# Patient Record
Sex: Male | Born: 1960
Health system: Southern US, Community
[De-identification: ages and names within clinical notes are randomized; demographics above are authoritative.]

## PROBLEM LIST (undated history)

## (undated) DIAGNOSIS — I10 Essential (primary) hypertension: Secondary | ICD-10-CM

## (undated) HISTORY — PX: BACK SURGERY: SHX140

---

## 2003-07-21 ENCOUNTER — Emergency Department (HOSPITAL_COMMUNITY): Admission: EM | Admit: 2003-07-21 | Discharge: 2003-07-22 | Payer: Self-pay | Admitting: Emergency Medicine

## 2003-12-01 ENCOUNTER — Ambulatory Visit (HOSPITAL_BASED_OUTPATIENT_CLINIC_OR_DEPARTMENT_OTHER): Admission: RE | Admit: 2003-12-01 | Discharge: 2003-12-01 | Payer: Self-pay | Admitting: Orthopedic Surgery

## 2003-12-01 ENCOUNTER — Ambulatory Visit (HOSPITAL_COMMUNITY): Admission: RE | Admit: 2003-12-01 | Discharge: 2003-12-01 | Payer: Self-pay | Admitting: Orthopedic Surgery

## 2005-08-25 ENCOUNTER — Emergency Department (HOSPITAL_COMMUNITY): Admission: EM | Admit: 2005-08-25 | Discharge: 2005-08-25 | Payer: Self-pay | Admitting: Emergency Medicine

## 2006-03-17 ENCOUNTER — Emergency Department (HOSPITAL_COMMUNITY): Admission: EM | Admit: 2006-03-17 | Discharge: 2006-03-18 | Payer: Self-pay | Admitting: *Deleted

## 2006-03-22 ENCOUNTER — Emergency Department (HOSPITAL_COMMUNITY): Admission: EM | Admit: 2006-03-22 | Discharge: 2006-03-22 | Payer: Self-pay | Admitting: Emergency Medicine

## 2011-09-08 ENCOUNTER — Encounter (HOSPITAL_COMMUNITY): Payer: Self-pay | Admitting: Emergency Medicine

## 2011-09-08 ENCOUNTER — Emergency Department (HOSPITAL_COMMUNITY)
Admission: EM | Admit: 2011-09-08 | Discharge: 2011-09-09 | Disposition: A | Discharge status: Home / Self Care | Payer: Self-pay | Attending: Emergency Medicine | Admitting: Emergency Medicine

## 2011-09-08 DIAGNOSIS — N509 Disorder of male genital organs, unspecified: Secondary | ICD-10-CM | POA: Insufficient documentation

## 2011-09-08 DIAGNOSIS — R109 Unspecified abdominal pain: Secondary | ICD-10-CM | POA: Insufficient documentation

## 2011-09-08 DIAGNOSIS — I861 Scrotal varices: Secondary | ICD-10-CM | POA: Insufficient documentation

## 2011-09-08 MED ORDER — SODIUM CHLORIDE 0.9 % IV SOLN: Freq: Once | INTRAVENOUS | Status: DC

## 2011-09-08 NOTE — ED Provider Notes (Signed)
Medical screening examination/treatment/procedure(s) were performed by non-physician practitioner and as supervising physician I was immediately available for consultation/collaboration.  Jasmine Awe, MD 09/08/11 [phone]

## 2011-09-08 NOTE — ED Notes (Signed)
Pt alert, nad, c/o left testicle pain and swelling, onset this evening, pt denies trauma or injury, denies changes in bowel or bladder, resp even unlabored, skin pwd

## 2011-09-08 NOTE — ED Provider Notes (Addendum)
History     CSN: [id number]  Arrival date & time 09/08/11  2201   None     Chief Complaint  Patient presents with  . 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  Denies injury, previous history of same, dysuria, penile discharge  The history is provided by the patient.    History reviewed. No pertinent past medical history.  Past Surgical History  Procedure Date  . Back surgery     No family history on file.  History  Substance Use Topics  . Smoking status: Never Smoker   . Smokeless tobacco: Not on file  . Alcohol Use: No      Review of Systems  Constitutional: Negative for fever.  Respiratory: Negative for shortness of breath.   Gastrointestinal: Positive for abdominal pain. Negative for nausea and constipation.  Genitourinary: Positive for testicular pain. Negative for dysuria, frequency, discharge, penile swelling, scrotal swelling and penile pain.  Neurological: Negative for dizziness.    Allergies  Review of patient's allergies indicates no known allergies.  Home Medications  No current outpatient prescriptions on file.  BP 157/73  Pulse 69  Temp(Src) 98.5 F (36.9 C) (Oral)  Resp 16  Wt 160 lb (72.576 kg)  SpO2 98%  Physical Exam  Constitutional: He appears well-developed and well-nourished.  HENT:  Head: Normocephalic.  Eyes: Pupils are equal, round, and reactive to light.  Neck: Normal range of motion.  Cardiovascular: Normal rate.   Pulmonary/Chest: Effort normal.  Abdominal: Soft. Bowel sounds are normal. He exhibits no distension. There is no tenderness.  Genitourinary: Penis normal. Cremasteric reflex is present. Right testis shows no mass, no swelling and no tenderness. Cremasteric reflex is not absent on the right side.  Left testis shows tenderness. Left testis shows no mass and no swelling. Cremasteric reflex is not absent on the left side. No penile tenderness.    ED Course  Procedures (including critical care time)  Labs Reviewed  URINALYSIS, ROUTINE W REFLEX MICROSCOPIC - Abnormal; Notable for the following:    Specific Gravity, Urine 1.032 (*)    All other components within normal limits  POCT I-STAT, CHEM 8 - Abnormal; Notable for the following:    Glucose, Bld 111 (*)    All other components within normal limits  CBC   US Scrotum  09/09/2011  *RADIOLOGY REPORT*  Clinical Data: Left-sided testicular pain.  SCROTAL ULTRASOUND DOPPLER ULTRASOUND OF THE TESTICLES  Technique:  Complete ultrasound examination of the testicles, epididymis, and other scrotal structures was performed.  Color and spectral Doppler ultrasound were also utilized to evaluate blood flow to the testicles.  Comparison:  None.  Findings:  The testicles are symmetric in size and echogenicity. The right testis measures 3.9 x 2.2 x 3.0 cm, while the left testis measures 4.1 x 2.2 x 2.7 cm. Diffuse microlithiasis is noted throughout both testes.  No testicular masses are seen.  Both epididymal heads are unremarkable in appearance.  A left-sided varicocele is noted along the superior aspect of the scrotum, augmented with Valsalva maneuver.  There is no evidence of hydrocele or other extra-testicular abnormality.  Blood flow is seen within both testicles on color Doppler sonography.  Doppler spectral waveforms show both arterial and venous flow signal in both testicles.  IMPRESSION:  1.  No evidence of testicular torsion. 2.  Diffuse  microlithiasis noted throughout both testes. 3.  Left-sided varicocele noted, augmented with Valsalva maneuver.  Original Report Authenticated By: Tonia Ghent, M.D.   [place] Art/ven Flow Abd Pelv Doppler  09/09/2011  *RADIOLOGY REPORT*  Clinical Data: Left-sided testicular pain.  SCROTAL ULTRASOUND DOPPLER ULTRASOUND OF  THE TESTICLES  Technique:  Complete ultrasound examination of the testicles, epididymis, and other scrotal structures was performed.  Color and spectral Doppler ultrasound were also utilized to evaluate blood flow to the testicles.  Comparison:  None.  Findings:  The testicles are symmetric in size and echogenicity. The right testis measures 3.9 x 2.2 x 3.0 cm, while the left testis measures 4.1 x 2.2 x 2.7 cm. Diffuse microlithiasis is noted throughout both testes.  No testicular masses are seen.  Both epididymal heads are unremarkable in appearance.  A left-sided varicocele is noted along the superior aspect of the scrotum, augmented with Valsalva maneuver.  There is no evidence of hydrocele or other extra-testicular abnormality.  Blood flow is seen within both testicles on color Doppler sonography.  Doppler spectral waveforms show both arterial and venous flow signal in both testicles.  IMPRESSION:  1.  No evidence of testicular torsion. 2.  Diffuse microlithiasis noted throughout both testes. 3.  Left-sided varicocele noted, augmented with Valsalva maneuver.  Original Report Authenticated By: Tonia Ghent, M.D.     1. Left varicocele       MDM  Concern for intermittent testicular torsion         Arman Filter, NP 09/08/11 2316  Arman Filter, NP 09/09/11 1610

## 2011-09-09 ENCOUNTER — Emergency Department (HOSPITAL_COMMUNITY): Payer: Self-pay

## 2011-09-09 LAB — POCT I-STAT, CHEM 8
BUN: 19 mg/dL (ref 6–23)
Calcium, Ion: 1.19 mmol/L (ref 1.12–1.32)
Chloride: 107 mEq/L (ref 96–112)
Glucose, Bld: 111 mg/dL — ABNORMAL HIGH (ref 70–99)
HCT: 46 % (ref 39.0–52.0)
TCO2: 25 mmol/L (ref 0–100)

## 2011-09-09 LAB — URINALYSIS, ROUTINE W REFLEX MICROSCOPIC
Bilirubin Urine: NEGATIVE
Ketones, ur: NEGATIVE mg/dL
Leukocytes, UA: NEGATIVE
Nitrite: NEGATIVE
Protein, ur: NEGATIVE mg/dL

## 2011-09-09 LAB — CBC
MCH: 30.1 pg (ref 26.0–34.0)
MCHC: 35.3 g/dL (ref 30.0–36.0)
MCV: 85.3 fL (ref 78.0–100.0)
Platelets: 238 10*3/uL (ref 150–400)

## 2011-09-09 NOTE — Discharge Instructions (Signed)
Varicocele A varicocele is a swelling of veins in the scrotum (the bag of skin that contains the testicles). It is most common in young men. It occurs most often on the left side. Small or painless varicoceles do not need treatment. Most often, this is not a serious problem, but further tests may be needed to confirm the diagnosis. Surgery may be needed if complications of varicoceles arise. Rarely, varicoceles can reoccur after surgery. CAUSES  The swelling is due to blood backing up in the vein that leads from the testicle back to the body. Blood backs up because the valves inside the vein are not working properly. Veins normally return blood to the heart. Valves in veins are supposed to be one-way valves. They should not allow blood to flow backwards. If the valves do not work well, blood can pool in a vein and make it swell. The same thing happens with varicose veins in the leg. SYMPTOMS  A varicocele most often causes no symptoms. When they occur, symptoms include:   Swelling on one side of the scrotum.   Swelling that is more obvious when standing up.   A lumpy feeling in the scrotum.   Heaviness on one side of the scrotum.   Dull ache in the scrotum, especially after exercise or prolonged standing or sitting.   Slower growth or reduced size of the testicle on the side of the varicocele (in young males).   Problems with fertility can arise if the testicle does not grow normally.  DIAGNOSIS  Varicocele is usually diagnosed by a physical exam. Sometimes ultrasonography is done. TREATMENT  Usually, varicoceles need no treatment. They are often routinely monitored on exam by your caregiver to ensure they do not slow the growth of the testicle on that side. Treatment may be needed if:  The varicocele is large.   There is a lot of pain.   The varicocele causes a decrease in the size of the testicle in a growing adolescent.   The other testicle is absent or not normal.   Varicoceles  are found on both sides of the scrotum.   There is pain when exercising.   There are fertility problems.  There are two types of treatment:  Surgery. The surgeon ties off the swollen veins. Surgery may be done with an incision in the skin or through a laparoscope. The surgery is usually done in an outpatient setting. Outpatient means there is no overnight stay in a hospital.   Embolization. A small tube is placed in a vein and guided into the swollen veins. X-rays are used to guide the small tube. Tiny metal coils or other blocking items are put through the tube. This blocks swollen veins and the flow of blood. This is usually done in an outpatient setting without the use of general anesthesia.  HOME CARE INSTRUCTIONS  To decrease discomfort:  Wear supportive underwear.   Use an athletic supporter for sports.   Only take over-the-counter or prescription medicines for pain or discomfort as directed by your caregiver.  SEEK MEDICAL CARE IF:   Pain is increasing.   Swelling does not decrease when lying down.   Testicle is smaller.   The testicle becomes enlarged, swollen, red, or painful.  Document Released: 10/03/2000 Document Revised: 03/09/2011 Document Reviewed: 10/07/2009 [facility] Patient Information 2012 [place], [place]. Daily, over-the-counter ibuprofen or Tylenol for any discomfort, please call Dr. Vernie Ammons at  [facility] urology for followup if needed

## 2011-09-09 NOTE — ED Provider Notes (Signed)
Medical screening examination/treatment/procedure(s) were performed by non-physician practitioner and as supervising physician I was immediately available for consultation/collaboration.  Jasmine Awe, MD 09/09/11 [phone]

## 2013-01-17 IMAGING — US US SCROTUM
1 series · 14 of 25 positions shown · non-contrast
Comparison: None.

CLINICAL DATA: Left-sided testicular pain.

SCROTAL ULTRASOUND
DOPPLER ULTRASOUND OF THE TESTICLES
TECHNIQUE: Complete ultrasound examination of the testicles,
epididymis, and other scrotal structures was performed.  Color and
spectral Doppler ultrasound were also utilized to evaluate blood
flow to the testicles.

[Series 1: us scrotum · 0.08mm/px · 14 of 37 slices shown]
[im 1/37]
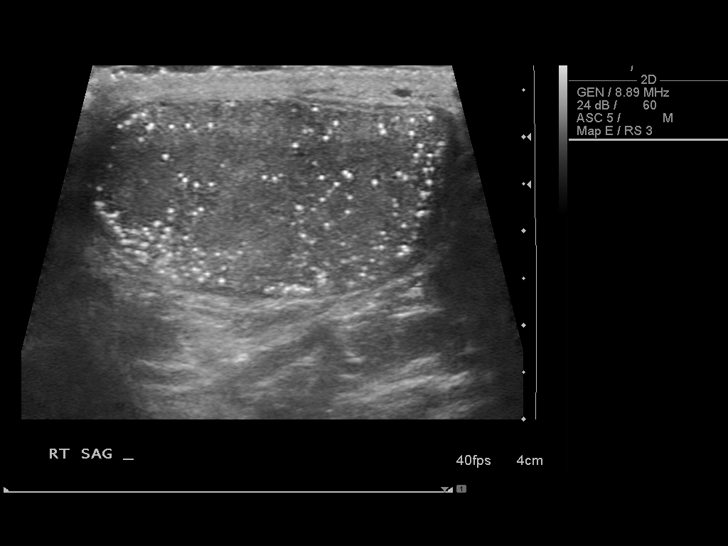
[im 4/37]
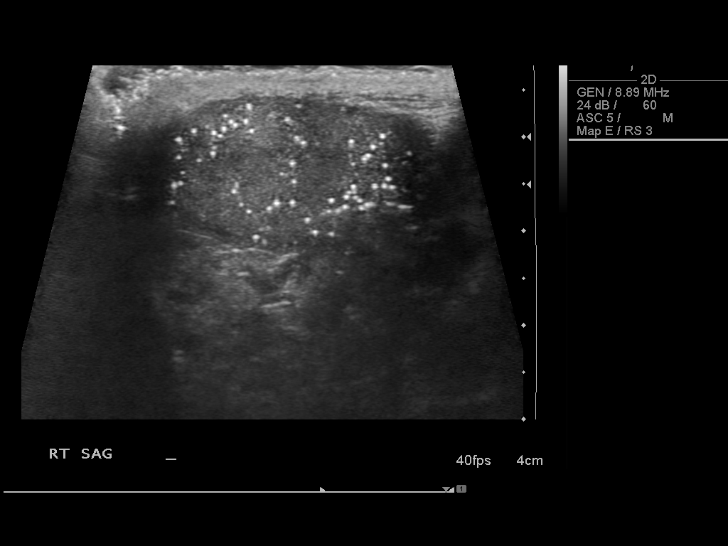
[im 7/37]
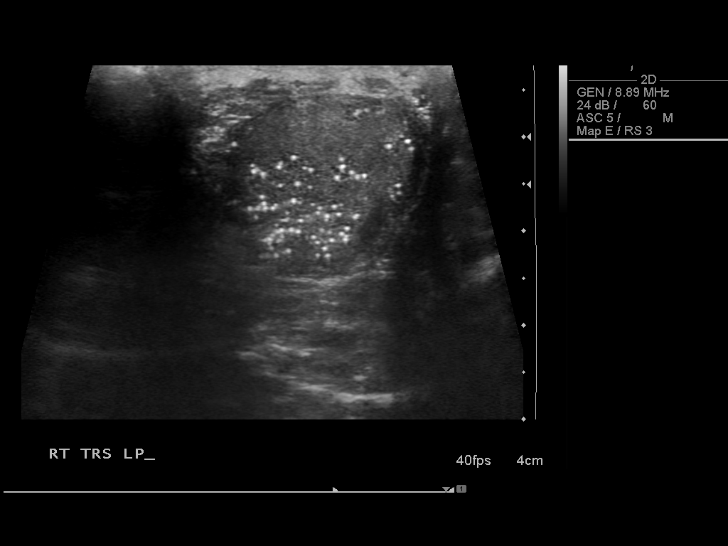
[im 10/37]
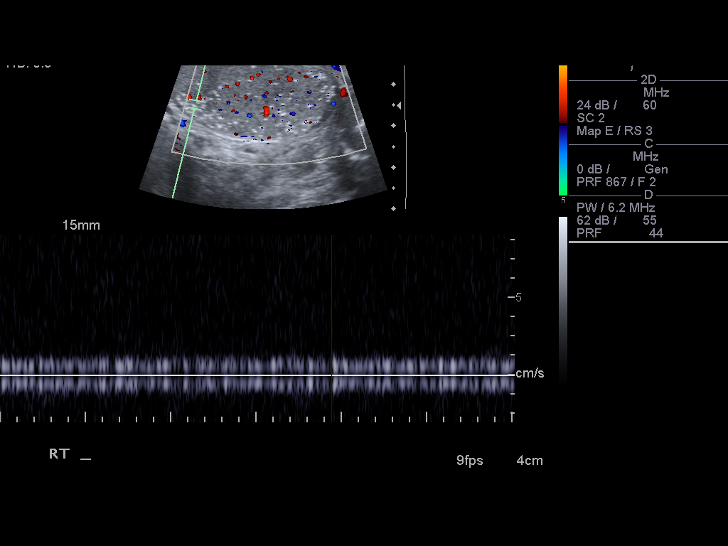
[im 13/37]
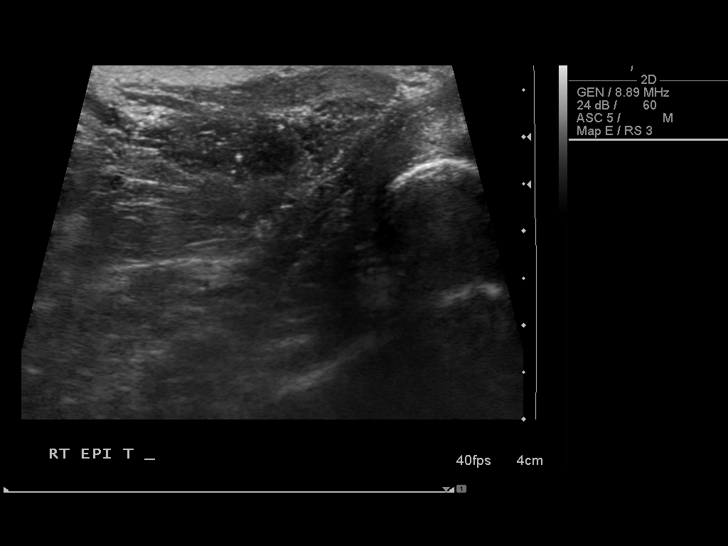
[im 14/37]
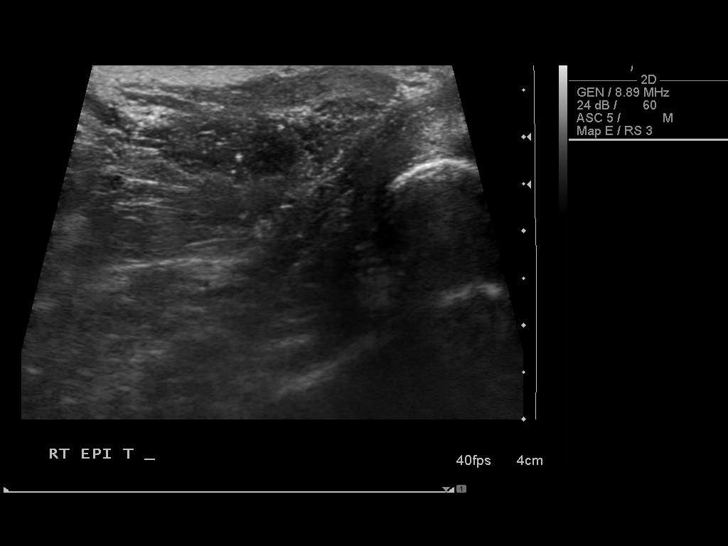
[im 17/37]
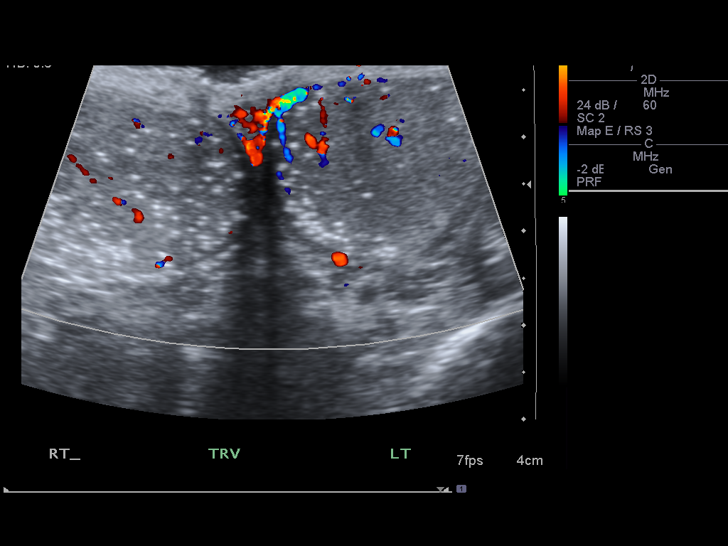
[im 20/37]
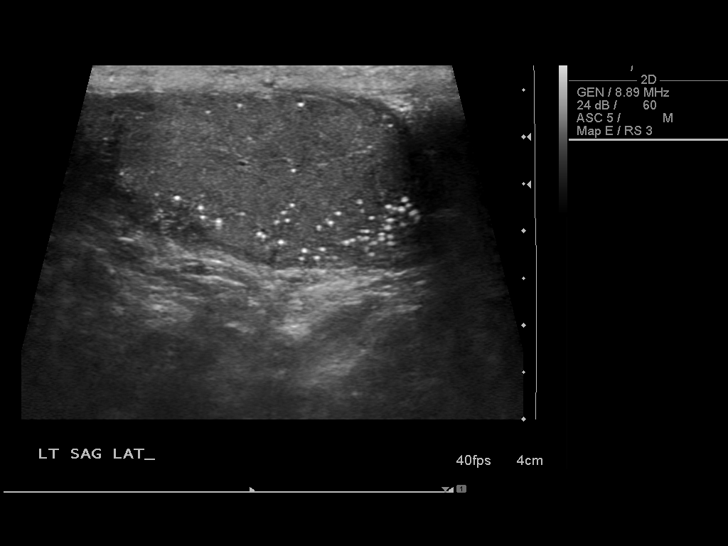
[im 23/37]
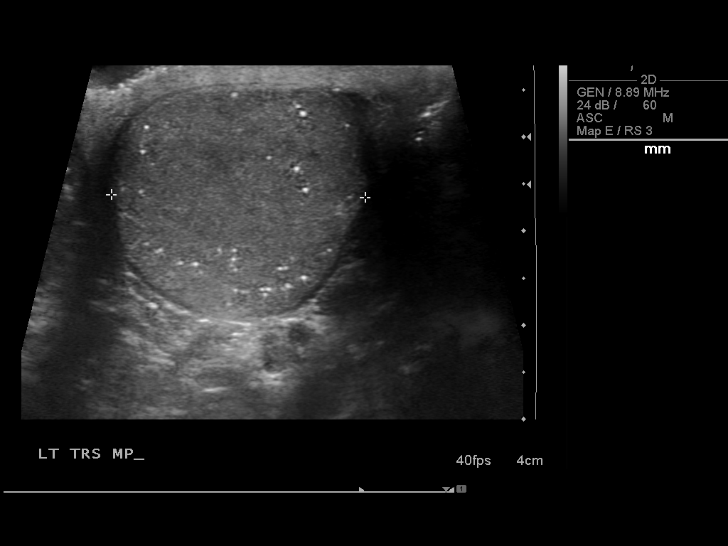
[im 25/37]
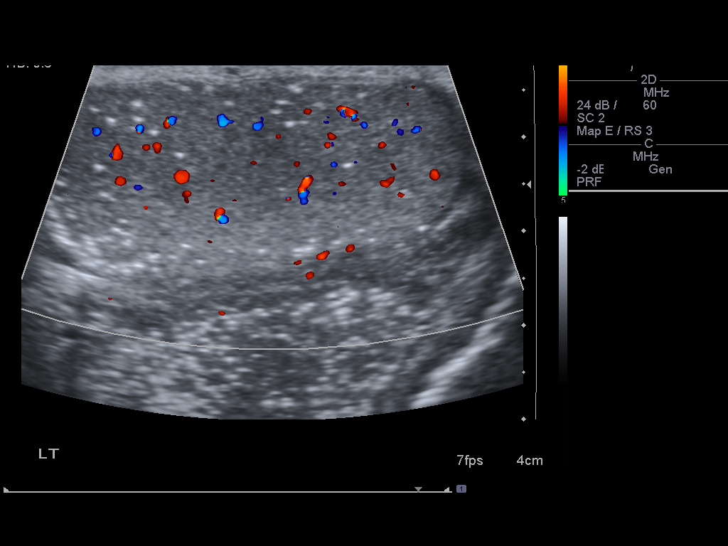
[im 28/37]
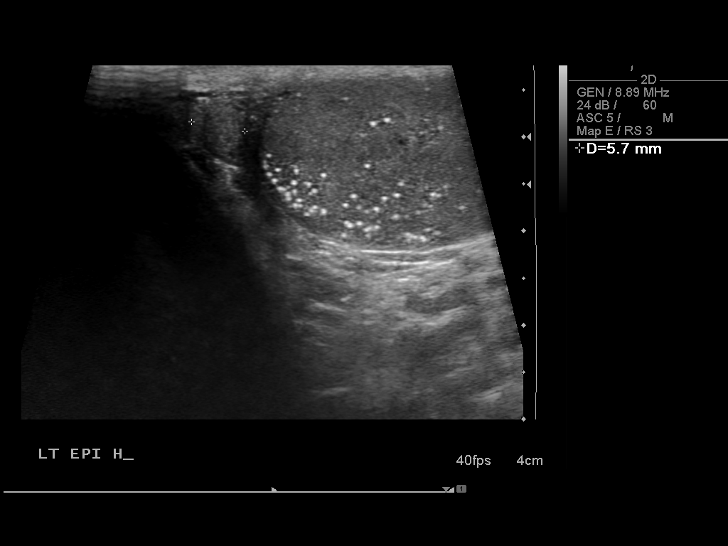
[im 31/37]
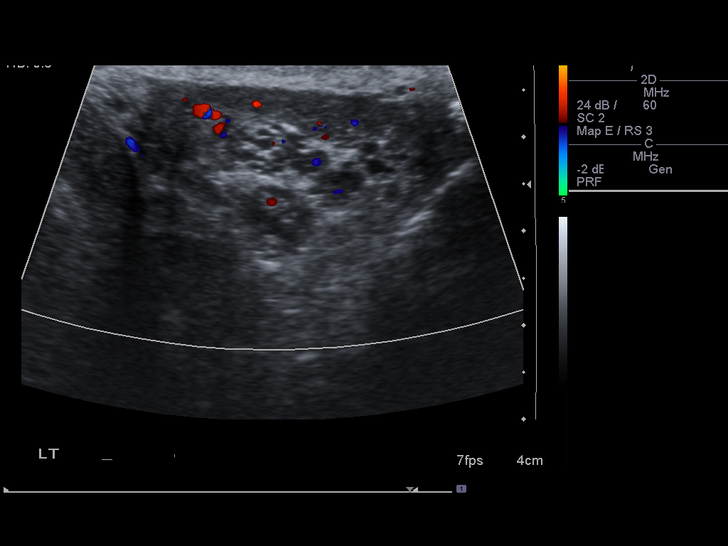
[im 34/37]
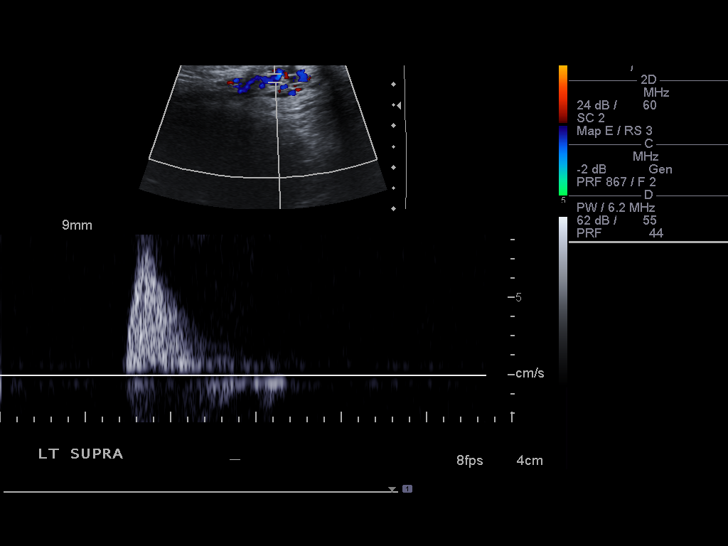
[im 37/37]
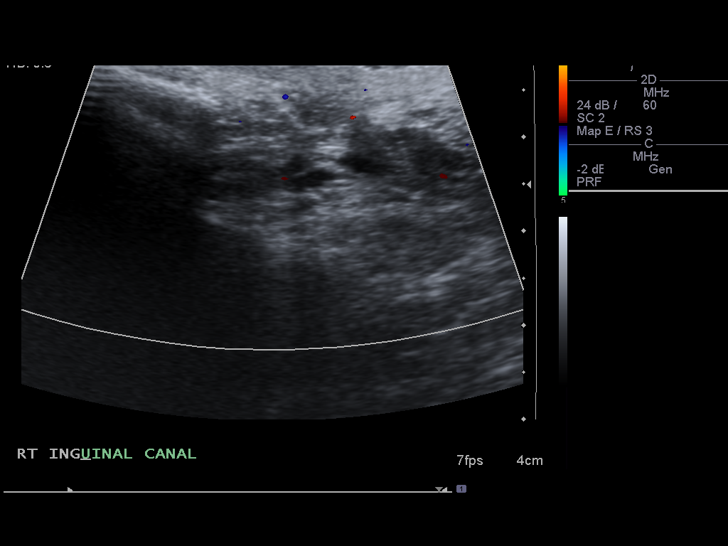

[14 of 25 positions shown; findings below may reference images not displayed]

FINDINGS: The testicles are symmetric in size and echogenicity.
The right testis measures 3.9 x 2.2 x 3.0 cm, while the left testis
measures 4.1 x 2.2 x 2.7 cm. Diffuse microlithiasis is noted
throughout both testes.  No testicular masses are seen.

Both epididymal heads are unremarkable in appearance.  A left-sided
varicocele is noted along the superior aspect of the scrotum,
augmented with Valsalva maneuver.  There is no evidence of
hydrocele or other extra-testicular abnormality.

Blood flow is seen within both testicles on color Doppler
sonography.  Doppler spectral waveforms show both arterial and
venous flow signal in both testicles.
IMPRESSION: 1.  No evidence of testicular torsion.
2.  Diffuse microlithiasis noted throughout both testes.
3.  Left-sided varicocele noted, augmented with Valsalva maneuver.

## 2013-07-16 ENCOUNTER — Encounter (HOSPITAL_COMMUNITY): Payer: Self-pay | Admitting: Emergency Medicine

## 2013-07-16 ENCOUNTER — Emergency Department (HOSPITAL_COMMUNITY)
Admission: EM | Admit: 2013-07-16 | Discharge: 2013-07-16 | Disposition: A | Discharge status: Home / Self Care | Payer: Self-pay | Attending: Emergency Medicine | Admitting: Emergency Medicine

## 2013-07-16 ENCOUNTER — Emergency Department (HOSPITAL_COMMUNITY): Payer: Self-pay

## 2013-07-16 DIAGNOSIS — J159 Unspecified bacterial pneumonia: Secondary | ICD-10-CM | POA: Insufficient documentation

## 2013-07-16 DIAGNOSIS — IMO0001 Reserved for inherently not codable concepts without codable children: Secondary | ICD-10-CM | POA: Insufficient documentation

## 2013-07-16 DIAGNOSIS — R52 Pain, unspecified: Secondary | ICD-10-CM | POA: Insufficient documentation

## 2013-07-16 DIAGNOSIS — J189 Pneumonia, unspecified organism: Secondary | ICD-10-CM

## 2013-07-16 LAB — CBC WITH DIFFERENTIAL/PLATELET
BASOS ABS: 0 10*3/uL (ref 0.0–0.1)
BASOS PCT: 0 % (ref 0–1)
EOS ABS: 0 10*3/uL (ref 0.0–0.7)
EOS PCT: 0 % (ref 0–5)
HCT: 45.3 % (ref 39.0–52.0)
HEMOGLOBIN: 16 g/dL (ref 13.0–17.0)
LYMPHS ABS: 0.9 10*3/uL (ref 0.7–4.0)
LYMPHS PCT: 5 % — AB (ref 12–46)
MCH: 30 pg (ref 26.0–34.0)
MCHC: 35.3 g/dL (ref 30.0–36.0)
MCV: 85 fL (ref 78.0–100.0)
MONO ABS: 0.2 10*3/uL (ref 0.1–1.0)
Monocytes Relative: 1 % — ABNORMAL LOW (ref 3–12)
NEUTROS ABS: 16.1 10*3/uL — AB (ref 1.7–7.7)
NEUTROS PCT: 94 % — AB (ref 43–77)
Platelets: 194 10*3/uL (ref 150–400)
RBC: 5.33 MIL/uL (ref 4.22–5.81)
RDW: 13.4 % (ref 11.5–15.5)
WBC MORPHOLOGY: INCREASED
WBC: 17.2 10*3/uL — ABNORMAL HIGH (ref 4.0–10.5)

## 2013-07-16 LAB — BASIC METABOLIC PANEL
BUN: 17 mg/dL (ref 6–23)
CALCIUM: 8.9 mg/dL (ref 8.4–10.5)
CO2: 26 mEq/L (ref 19–32)
CREATININE: 0.95 mg/dL (ref 0.50–1.35)
Chloride: 95 mEq/L — ABNORMAL LOW (ref 96–112)
Glucose, Bld: 128 mg/dL — ABNORMAL HIGH (ref 70–99)
Potassium: 4.1 mEq/L (ref 3.7–5.3)
Sodium: 136 mEq/L — ABNORMAL LOW (ref 137–147)

## 2013-07-16 MED ORDER — LEVOFLOXACIN 500 MG PO TABS: 500.0000 mg | ORAL_TABLET | Freq: Every day | ORAL | Status: DC

## 2013-07-16 MED ORDER — HYDROCODONE-HOMATROPINE 5-1.5 MG/5ML PO SYRP: 5.0000 mL | ORAL_SOLUTION | Freq: Four times a day (QID) | ORAL | Status: DC | PRN

## 2013-07-16 MED ORDER — ACETAMINOPHEN 325 MG PO TABS
650.0000 mg | ORAL_TABLET | Freq: Once | ORAL | Status: AC
  Administered 2013-07-16: 650 mg via ORAL
  Filled 2013-07-16: qty 2

## 2013-07-16 MED ORDER — ALBUTEROL SULFATE HFA 108 (90 BASE) MCG/ACT IN AERS
2.0000 | INHALATION_SPRAY | RESPIRATORY_TRACT | Status: DC | PRN
  Administered 2013-07-16: 2 via RESPIRATORY_TRACT
  Filled 2013-07-16: qty 6.7

## 2013-07-16 NOTE — ED Provider Notes (Signed)
CSN: [id number]     Arrival date & time 07/16/13  0901 History   First MD Initiated Contact with Patient 07/16/13 1017     Chief Complaint  Patient presents with  . Cough  . Generalized Body Aches   (Consider location/radiation/quality/duration/timing/severity/associated sxs/prior Treatment) HPI Comments: Patient is a 53 year old male who presents with a 1 week history of fever. Symptoms started gradually and have been intermittent since the onset. He is unsure how high his temperature has been at home. Patient reports associated myalgias, chills and productive cough with yellow sputum. No aggravating/alleviating factors. No known sick contacts. No other associated symptoms.   Patient is a 54 y.o. male presenting with cough.  Cough Associated symptoms: fever and myalgias   Associated symptoms: no chest pain, no chills and no shortness of breath     History reviewed. No pertinent past medical history. Past Surgical History  Procedure Laterality Date  . Back surgery     History reviewed. No pertinent family history. History  Substance Use Topics  . Smoking status: Never Smoker   . Smokeless tobacco: Not on file  . Alcohol Use: Yes    Review of Systems  Constitutional: Positive for fever. Negative for chills and fatigue.  HENT: Negative for trouble swallowing.   Eyes: Negative for visual disturbance.  Respiratory: Positive for cough. Negative for shortness of breath.   Cardiovascular: Negative for chest pain and palpitations.  Gastrointestinal: Negative for nausea, vomiting, abdominal pain and diarrhea.  Genitourinary: Negative for dysuria and difficulty urinating.  Musculoskeletal: Positive for myalgias. Negative for arthralgias and neck pain.  Skin: Negative for color change.  Neurological: Negative for dizziness and weakness.  Psychiatric/Behavioral: Negative for dysphoric mood.    Allergies  Review of patient's allergies indicates no known allergies.  Home Medications    Current Outpatient Rx  Name  Route  Sig  Dispense  Refill  . acetaminophen (TYLENOL) 500 MG tablet   Oral   Take 500 mg by mouth every 6 (six) hours as needed for moderate pain or fever.         . Camphor-Eucalyptus-Menthol (VICKS VAPORUB EX)   Apply externally   Apply 1 application topically daily as needed (body aches).         . Chlorphen-Pseudoephed-APAP (THERAFLU FLU/COLD PO)   Oral   Take 1-2 packets by mouth daily as needed (bady ache).         Marland Kitchen ibuprofen (ADVIL,MOTRIN) 200 MG tablet   Oral   Take 400 mg by mouth every 6 (six) hours as needed for fever or mild pain.         . Menthol (HALLS COUGH DROPS MT)   Mouth/Throat   Use as directed 1-3 tablets in the mouth or throat daily as needed (cough).          BP 132/62  Pulse 102  Temp(Src) 100.2 F (37.9 C) (Oral)  Resp 18  Ht 5\' 6"  (1.676 m)  Wt 160 lb (72.576 kg)  BMI 25.84 kg/m2  SpO2 96% Physical Exam  Nursing note and vitals reviewed. Constitutional: He is oriented to person, place, and time. He appears well-developed and well-nourished. No distress.  HENT:  Head: Normocephalic and atraumatic.  Mouth/Throat: Oropharynx is clear and moist. No oropharyngeal exudate.  Eyes: Conjunctivae and EOM are normal.  Neck: Normal range of motion.  Cardiovascular: Normal rate and regular rhythm.  Exam reveals no gallop and no friction rub.   No murmur heard. Pulmonary/Chest: Effort normal. He has  no wheezes. He has no rales. He exhibits no tenderness.  Left lower lung crackles noted.   Abdominal: Soft. He exhibits no distension. There is no tenderness. There is no rebound.  Musculoskeletal: Normal range of motion.  Neurological: He is alert and oriented to person, place, and time. Coordination normal.  Speech is goal-oriented. Moves limbs without ataxia.   Skin: Skin is warm and dry.  Psychiatric: He has a normal mood and affect. His behavior is normal.    ED Course  Procedures (including critical care  time) Labs Review Labs Reviewed  CBC WITH DIFFERENTIAL - Abnormal; Notable for the following:    WBC 17.2 (*)    Neutrophils Relative % 94 (*)    Lymphocytes Relative 5 (*)    Monocytes Relative 1 (*)    Neutro Abs 16.1 (*)    All other components within normal limits  BASIC METABOLIC PANEL - Abnormal; Notable for the following:    Sodium 136 (*)    Chloride 95 (*)    Glucose, Bld 128 (*)    All other components within normal limits   Imaging Review Dg Chest 2 View  07/16/2013   CLINICAL DATA:  Fever with body aches.  Cough.  EXAM: CHEST  2 VIEW  COMPARISON:  None.  FINDINGS: There is focal airspace disease in the superior segment of the left lower lobe. Patchy airspace opacity extends into the more inferomedial component of the lower lobe, associated with air bronchograms. The right lung is clear. There is no pleural effusion. The heart size and mediastinal contours are normal.  IMPRESSION: Left lower lobe pneumonia. This should be followed to radiographic clearing to exclude an underlying mass lesion.   Electronically Signed   By: Roxy HorsemanBill  Veazey M.D.   On: 07/16/2013 10:14    EKG Interpretation   None       MDM   1. CAP (community acquired pneumonia)     10:47 AM Chest xray shows pneumonia in left lower lobe. Patient has a low grade temp and slightly tachycardic. Patient does not appear toxic and I believe he can be treated for CAP at home. Patient will be treated with Levaquin and hycodan. Patient will have albuterol inhaler. Patient instructed to return with worsening or concerning symptoms.    Emilia BeckKaitlyn Wyatt Thorstenson, PA-C 07/16/13 1520

## 2013-07-16 NOTE — Discharge Instructions (Signed)
Take Levaquin as directed until gone. Take hycodan as needed for cough. Use inhaler as needed for shortness of breath. Refer to attached documents for more information. Return to the ED with worsening or concerning symptoms.

## 2013-07-16 NOTE — ED Notes (Signed)
Onset 6 days ago general body achy productive cough yellow green and chills.

## 2013-07-17 NOTE — ED Provider Notes (Signed)
Medical screening examination/treatment/procedure(s) were performed by non-physician practitioner and as supervising physician I was immediately available for consultation/collaboration.  EKG Interpretation   None        Flint MelterElliott L Paul Torpey, MD 07/17/13 1600

## 2014-11-24 IMAGING — CR DG CHEST 2V
2 series · 2 of 2 positions shown · non-contrast
Comparison: None.

CLINICAL DATA: Fever with body aches.  Cough.

EXAM:
CHEST  2 VIEW

[w chest pa]
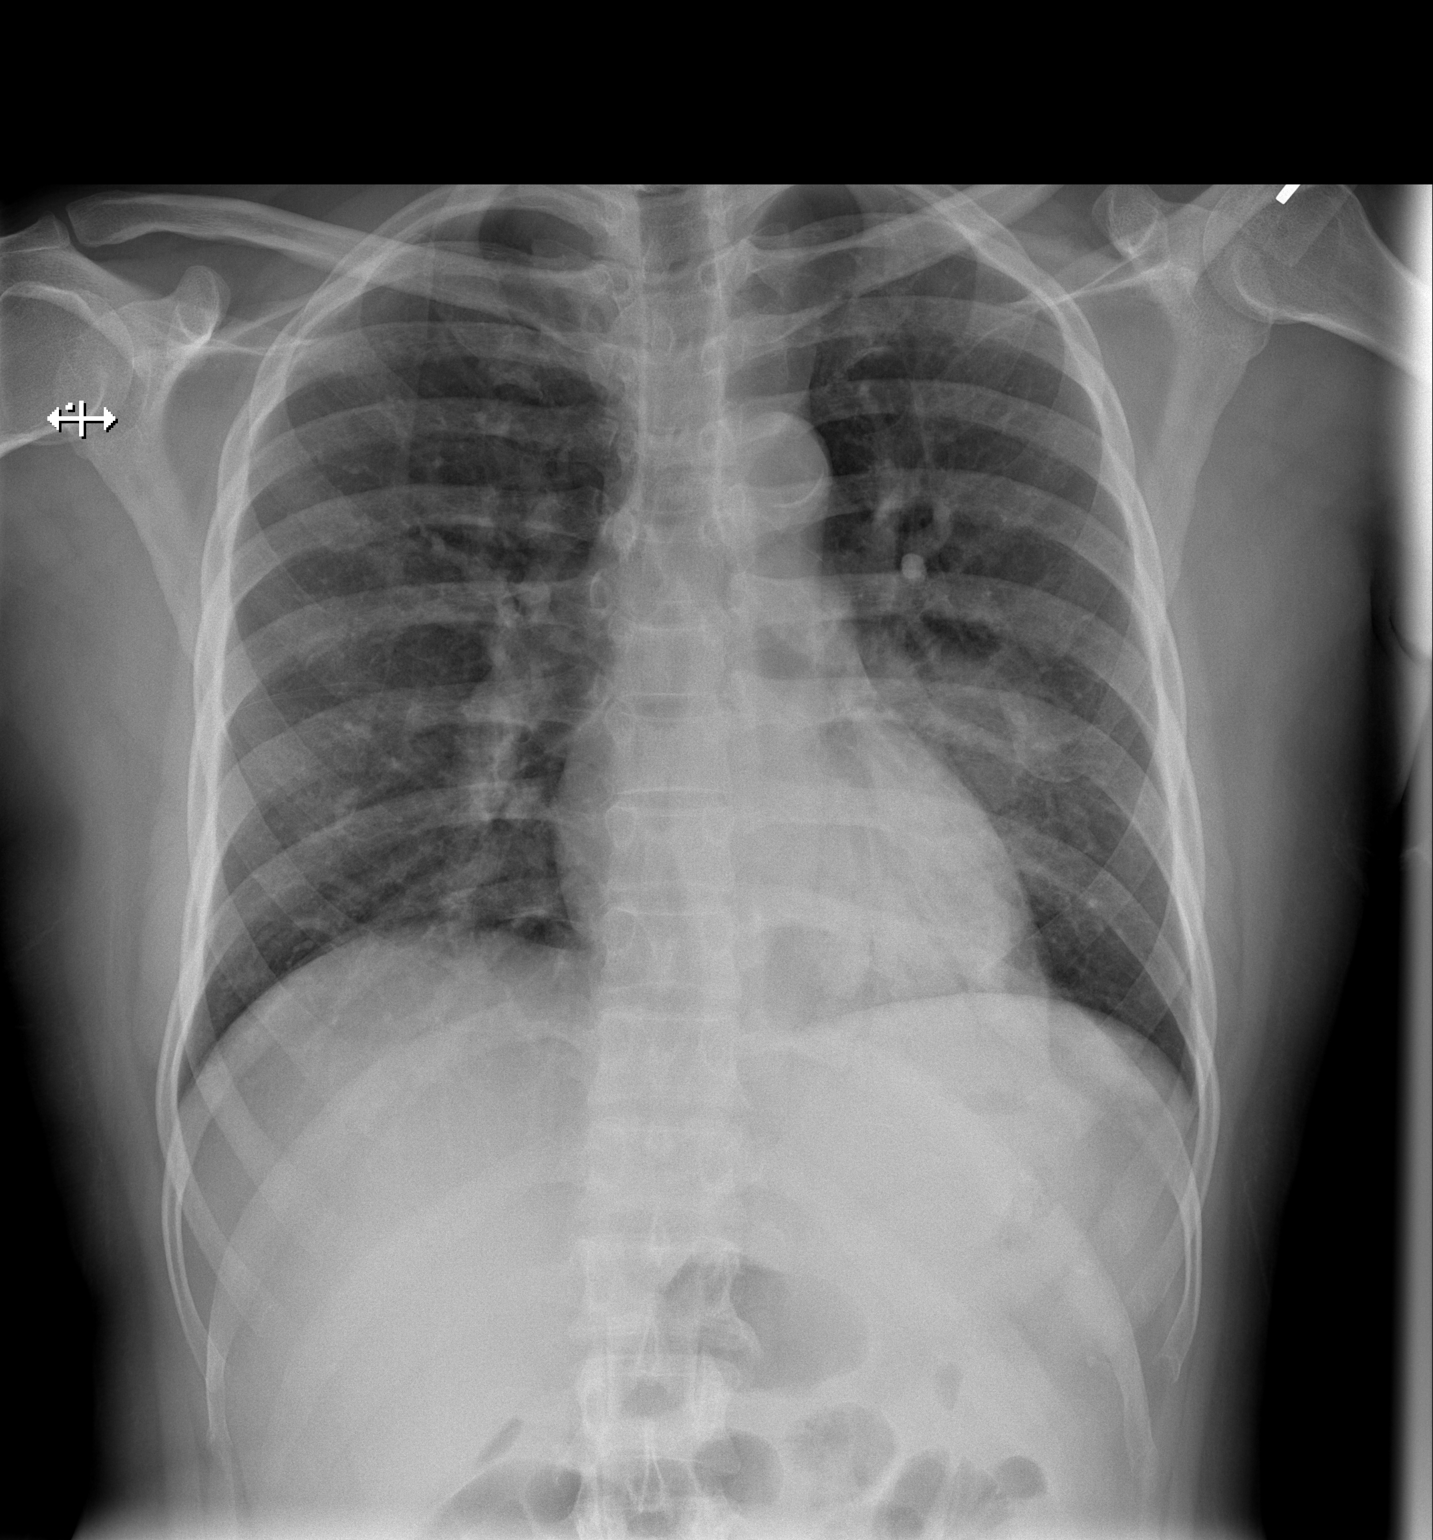

[w chest lat]
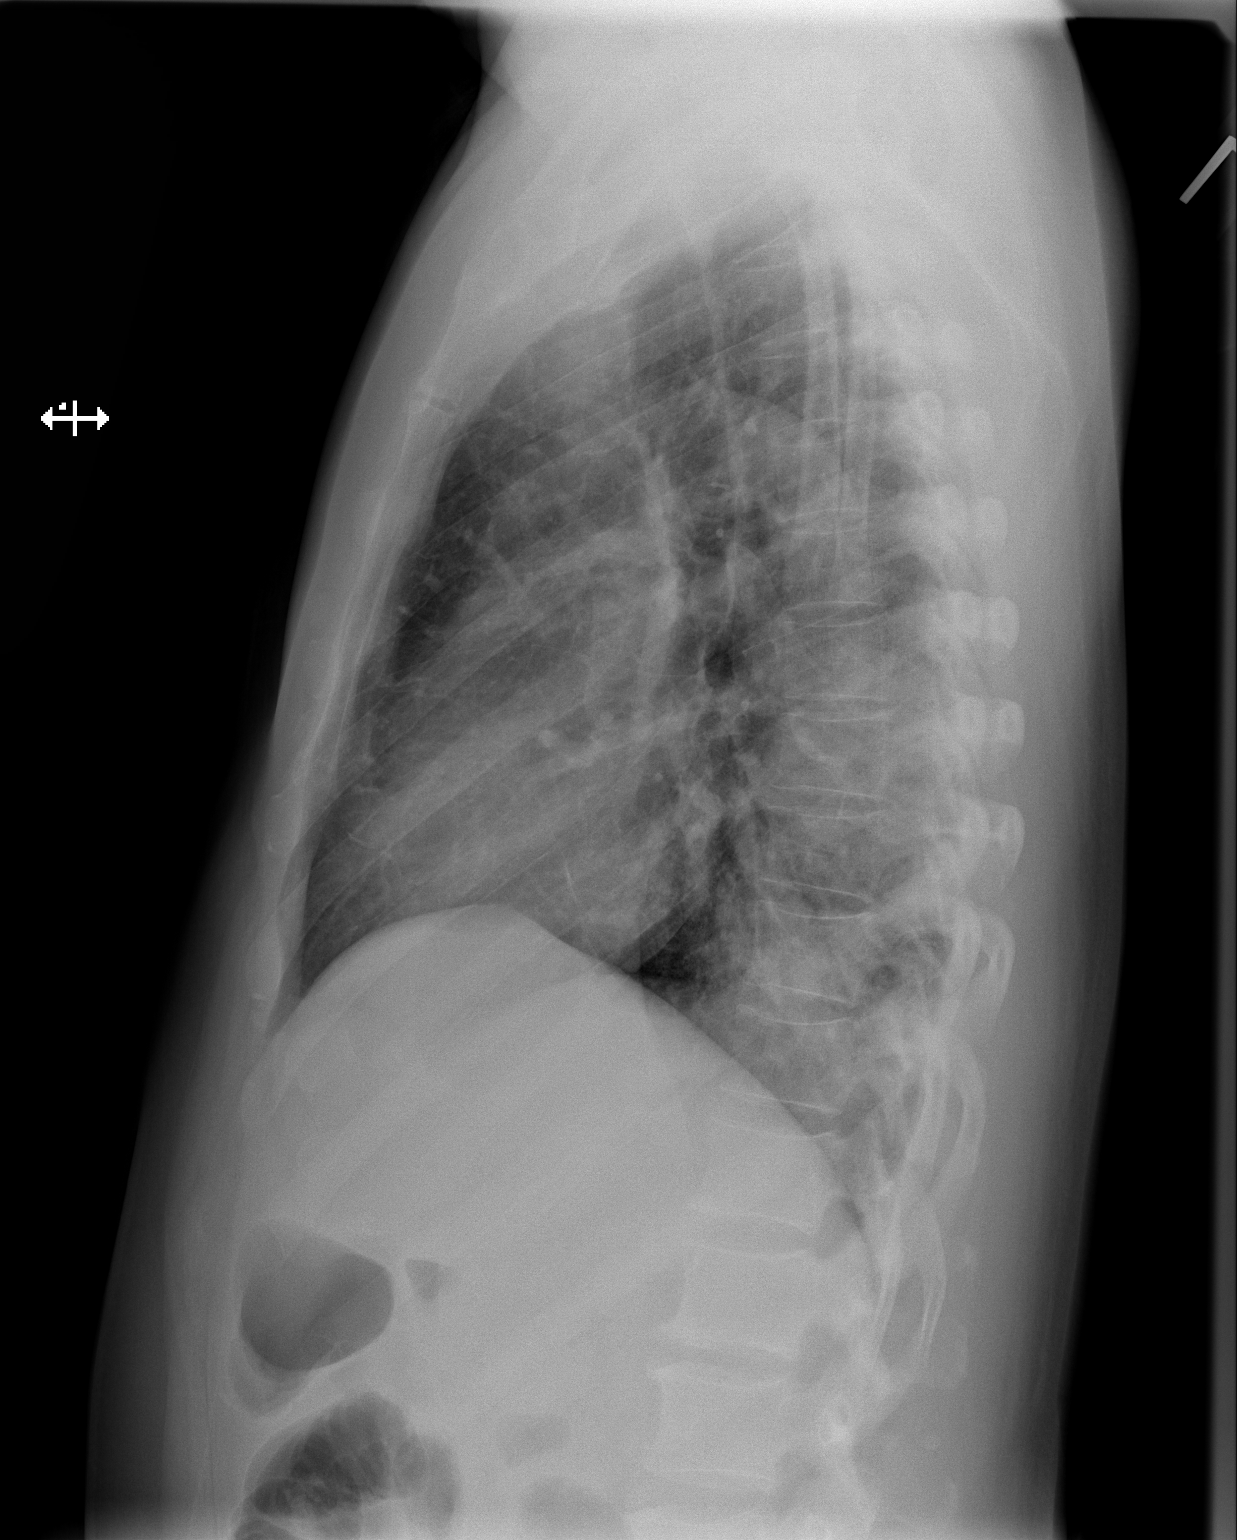

[2 of 2 positions shown; findings below may reference images not displayed]

FINDINGS: There is focal airspace disease in the superior segment of the left
lower lobe. Patchy airspace opacity extends into the more
inferomedial component of the lower lobe, associated with air
bronchograms. The right lung is clear. There is no pleural effusion.
The heart size and mediastinal contours are normal.
IMPRESSION: Left lower lobe pneumonia. This should be followed to radiographic
clearing to exclude an underlying mass lesion.

## 2014-12-12 ENCOUNTER — Ambulatory Visit (INDEPENDENT_AMBULATORY_CARE_PROVIDER_SITE_OTHER): Payer: BLUE CROSS/BLUE SHIELD

## 2014-12-12 ENCOUNTER — Ambulatory Visit (INDEPENDENT_AMBULATORY_CARE_PROVIDER_SITE_OTHER): Payer: BLUE CROSS/BLUE SHIELD | Admitting: Family Medicine

## 2014-12-12 VITALS — BP 122/74 | HR 91 | Temp 99.0°F | Resp 17 | Ht 65.5 in | Wt 160.0 lb

## 2014-12-12 DIAGNOSIS — S0512XA Contusion of eyeball and orbital tissues, left eye, initial encounter: Secondary | ICD-10-CM

## 2014-12-12 DIAGNOSIS — H1132 Conjunctival hemorrhage, left eye: Secondary | ICD-10-CM
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
## 2014-12-12 NOTE — Patient Instructions (Addendum)
Tylenol 1000 mg 3 times daily or ibuprofen 600 mg 3 times daily as needed for pain  Return if further problems  Special caution if you have any problems acutely with your vision, increased eye pain, flashing lights in the eye or floaters, or other concerns come back and get rechecked.

## 2018-07-21 ENCOUNTER — Ambulatory Visit (HOSPITAL_COMMUNITY)
Admission: EM | Admit: 2018-07-21 | Discharge: 2018-07-21 | Disposition: A | Discharge status: Home / Self Care | Payer: Commercial Managed Care - PPO | Attending: Family Medicine | Admitting: Family Medicine

## 2018-07-21 ENCOUNTER — Encounter (HOSPITAL_COMMUNITY): Payer: Self-pay | Admitting: *Deleted

## 2018-07-21 DIAGNOSIS — M5431 Sciatica, right side: Secondary | ICD-10-CM | POA: Diagnosis not present

## 2018-07-21 HISTORY — DX: Essential (primary) hypertension: I10

## 2018-07-21 MED ORDER — CYCLOBENZAPRINE HCL 5 MG PO TABS: 5.0000 mg | ORAL_TABLET | Freq: Every day | ORAL | 0 refills | Status: AC

## 2018-07-21 MED ORDER — PREDNISONE 50 MG PO TABS: ORAL_TABLET | ORAL | 0 refills | Status: AC
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
## 2018-07-21 NOTE — ED Triage Notes (Signed)
Denies injury.  C/O low back pain x 1.5 wks.  C/O pain radiating down into RLE without any parasthesias.

## 2018-07-23 ENCOUNTER — Encounter (HOSPITAL_COMMUNITY): Payer: Self-pay | Admitting: Emergency Medicine

## 2018-07-23 ENCOUNTER — Other Ambulatory Visit: Payer: Self-pay

## 2018-07-23 DIAGNOSIS — Z87891 Personal history of nicotine dependence: Secondary | ICD-10-CM | POA: Insufficient documentation

## 2018-07-23 DIAGNOSIS — R1013 Epigastric pain: Secondary | ICD-10-CM | POA: Insufficient documentation

## 2018-07-23 DIAGNOSIS — I10 Essential (primary) hypertension: Secondary | ICD-10-CM | POA: Insufficient documentation

## 2018-07-23 DIAGNOSIS — R1084 Generalized abdominal pain: Secondary | ICD-10-CM | POA: Diagnosis not present

## 2018-07-23 NOTE — ED Triage Notes (Addendum)
Pt arriving via [facility]. Pt reports chronic back pain which he has taken medications for but also took a generic brand of male libido enhancer and began having mid abdominal pain. Pt hypertensive upon arrival. Denies any pain at this time.

## 2018-07-24 ENCOUNTER — Emergency Department (HOSPITAL_COMMUNITY)
Admission: EM | Admit: 2018-07-24 | Discharge: 2018-07-24 | Disposition: A | Discharge status: Home / Self Care | Payer: Commercial Managed Care - PPO | Attending: Emergency Medicine | Admitting: Emergency Medicine

## 2018-07-24 DIAGNOSIS — R1013 Epigastric pain: Secondary | ICD-10-CM

## 2018-07-24 DIAGNOSIS — I10 Essential (primary) hypertension: Secondary | ICD-10-CM | POA: Diagnosis not present

## 2018-07-24 DIAGNOSIS — Z87891 Personal history of nicotine dependence: Secondary | ICD-10-CM | POA: Diagnosis not present

## 2018-07-24 MED ORDER — PANTOPRAZOLE SODIUM 40 MG PO TBEC
40.0000 mg | DELAYED_RELEASE_TABLET | Freq: Once | ORAL | Status: AC
  Administered 2018-07-24: 40 mg via ORAL
  Filled 2018-07-24: qty 1

## 2018-07-24 NOTE — ED Provider Notes (Signed)
[facility] Provider Note: William Dell, MD, FACEP  CSN: [id number] MRN: [id number] ARRIVAL: 07/23/18 at 2236 ROOM: WA20/WA20   CHIEF COMPLAINT  Abdominal Pain and Hypertension   HISTORY OF PRESENT ILLNESS  07/24/18 2:10 AM William Vargas is a 58 y.o. male with a history of chronic back pain.  About 10 PM yesterday evening he took 2 herbal "male enhancement" supplement capsules and then ate some pork skins.  He subsequently developed what he describes as severe epigastric cramping.  He has some mild associated nausea but no vomiting.  His symptoms lasted about 10 to 15 minutes and abated.  He has been asymptomatic since.   Past Medical History:  Diagnosis Date  . Hypertension    non-compliance with meds    Past Surgical History:  Procedure Laterality Date  . BACK SURGERY      Family History  Problem Relation Age of Onset  . Diabetes Mother     Social History   Tobacco Use  . Smoking status: Former Games developer  . Smokeless tobacco: Never Used  . Tobacco comment: quit 2001  Substance Use Topics  . Alcohol use: Yes    Comment: occasionally  . Drug use: Not Currently    Prior to Admission medications   Medication Sig Start Date End Date Taking? Authorizing Provider  acetaminophen (TYLENOL) 500 MG tablet Take 500 mg by mouth every 6 (six) hours as needed for moderate pain or fever.    [provider]  cyclobenzaprine (FLEXERIL) 5 MG tablet Take 1 tablet (5 mg total) by mouth at bedtime. 07/21/18   Elvina Sidle, MD  predniSONE (DELTASONE) 50 MG tablet One daily with food 07/21/18   Elvina Sidle, MD    Allergies Patient has no known allergies.   REVIEW OF SYSTEMS  Negative except as noted here or in the History of Present Illness.   PHYSICAL EXAMINATION  Initial Vital Signs Blood pressure (!) 159/82, pulse 78, temperature 98.7 F (37.1 C), temperature source Oral, resp. rate 16, height 5\' 7"  (1.702 m), weight 81.6 kg, SpO2 96  %.  Examination General: Well-developed, well-nourished male in no acute distress; appearance consistent with age of record HENT: normocephalic; atraumatic Eyes: pupils equal, round and reactive to light; extraocular muscles intact Neck: supple Heart: regular rate and rhythm Lungs: clear to auscultation bilaterally Abdomen: soft; nondistended; nontender; no masses or hepatosplenomegaly; bowel sounds present Extremities: No deformity; full range of motion; pulses normal Neurologic: Awake, alert and oriented; motor function intact in all extremities and symmetric; no facial droop Skin: Warm and dry Psychiatric: Flat affect   RESULTS  Summary of this visit's results, reviewed by myself:   EKG Interpretation  Date/Time:  Monday July 23 2018 22:43:40 EST Ventricular Rate:  81 PR Interval:    QRS Duration: 100 QT Interval:  394 QTC Calculation: 458 R Axis:   54 Text Interpretation:  Sinus rhythm Consider right atrial enlargement ST elevation, consider anterior injury Confirmed by Paula Libra ([id number]) on 07/23/2018 10:52:52 PM      Laboratory Studies: No results found for this or any previous visit (from the past 24 hour(s)). Imaging Studies: No results found.  ED COURSE and MDM  Nursing notes and initial vitals signs, including pulse oximetry, reviewed.  Vitals:   07/23/18 2238 07/23/18 2244  BP:  (!) 159/82  Pulse:  78  Resp:  16  Temp:  98.7 F (37.1 C)  TempSrc:  Oral  SpO2: 99% 96%  Weight:  81.6 kg  Height:  5\' 7"  (1.702 m)   Patient is asymptomatic for about 4 hours now.  I do not believe any further work-up is indicated at this time.  PROCEDURES    ED DIAGNOSES     ICD-10-CM   1. Epigastric pain R10.13        Zaara Sprowl, Jonny RuizJohn, MD 07/24/18 [phone]

## 2018-09-26 DIAGNOSIS — Z23 Encounter for immunization: Secondary | ICD-10-CM | POA: Diagnosis not present

## 2018-09-26 DIAGNOSIS — Z125 Encounter for screening for malignant neoplasm of prostate: Secondary | ICD-10-CM | POA: Diagnosis not present

## 2018-09-26 DIAGNOSIS — M25521 Pain in right elbow: Secondary | ICD-10-CM | POA: Diagnosis not present

## 2018-09-26 DIAGNOSIS — Z0000 Encounter for general adult medical examination without abnormal findings: Secondary | ICD-10-CM | POA: Diagnosis not present

## 2018-09-26 DIAGNOSIS — I10 Essential (primary) hypertension: Secondary | ICD-10-CM | POA: Diagnosis not present

## 2018-10-18 DIAGNOSIS — I10 Essential (primary) hypertension: Secondary | ICD-10-CM | POA: Diagnosis not present

## 2018-10-18 DIAGNOSIS — M25521 Pain in right elbow: Secondary | ICD-10-CM | POA: Diagnosis not present

## 2019-11-19 ENCOUNTER — Ambulatory Visit: Payer: Commercial Managed Care - PPO | Attending: Internal Medicine

## 2019-11-19 DIAGNOSIS — Z23 Encounter for immunization: Secondary | ICD-10-CM

## 2019-11-19 NOTE — Progress Notes (Signed)
   Covid-19 Vaccination Clinic  Name:  William Vargas    MRN: [id number] DOB: Jan 25, 1961  11/19/2019  [name] was observed post Covid-19 immunization for 15 minutes without incident. He was provided with Vaccine Information Sheet and instruction to access the V-Safe system.   [name] was instructed to call 911 with any severe reactions post vaccine: Marland Kitchen Difficulty breathing  . Swelling of face and throat  . A fast heartbeat  . A bad rash all over body  . Dizziness and weakness   Immunizations Administered    Name Date Dose VIS Date Route   Moderna COVID-19 Vaccine 11/19/2019 10:12 AM 0.5 mL 06/2019 Intramuscular   Manufacturer: Moderna   Lot: [id number]   NDC: 44514-604-79

## 2019-12-17 ENCOUNTER — Ambulatory Visit: Payer: Commercial Managed Care - PPO | Attending: Internal Medicine

## 2019-12-17 DIAGNOSIS — Z23 Encounter for immunization: Secondary | ICD-10-CM

## 2019-12-17 NOTE — Progress Notes (Signed)
   Covid-19 Vaccination Clinic  Name:  William Vargas    MRN: [id number] DOB: 1961/02/24  12/17/2019  William Vargas was observed post Covid-19 immunization for 15 minutes without incident. He was provided with Vaccine Information Sheet and instruction to access the V-Safe system.   William Vargas was instructed to call 911 with any severe reactions post vaccine: Marland Kitchen Difficulty breathing  . Swelling of face and throat  . A fast heartbeat  . A bad rash all over body  . Dizziness and weakness   Immunizations Administered    Name Date Dose VIS Date Route   Moderna COVID-19 Vaccine 12/17/2019  1:17 PM 0.5 mL 06/2019 Intramuscular   Manufacturer: Moderna   Lot: [id number]   NDC: 71252-479-98
